# Patient Record
Sex: Female | Born: 1955 | Race: White | Hispanic: No | Marital: Single | Smoking: Never smoker
Health system: Southern US, Community
[De-identification: ages and names within clinical notes are randomized; demographics above are authoritative.]

## PROBLEM LIST (undated history)

## (undated) DIAGNOSIS — K5792 Diverticulitis of intestine, part unspecified, without perforation or abscess without bleeding: Secondary | ICD-10-CM

## (undated) DIAGNOSIS — G629 Polyneuropathy, unspecified: Secondary | ICD-10-CM

---

## 2022-05-20 ENCOUNTER — Emergency Department (HOSPITAL_COMMUNITY): Payer: 59

## 2022-05-20 ENCOUNTER — Emergency Department (HOSPITAL_COMMUNITY)
Admission: EM | Admit: 2022-05-20 | Discharge: 2022-05-20 | Disposition: A | Discharge status: Home / Self Care | Payer: 59 | Attending: Emergency Medicine | Admitting: Emergency Medicine

## 2022-05-20 ENCOUNTER — Encounter (HOSPITAL_COMMUNITY): Payer: Self-pay

## 2022-05-20 DIAGNOSIS — K429 Umbilical hernia without obstruction or gangrene: Secondary | ICD-10-CM | POA: Insufficient documentation

## 2022-05-20 DIAGNOSIS — K760 Fatty (change of) liver, not elsewhere classified: Secondary | ICD-10-CM | POA: Diagnosis not present

## 2022-05-20 DIAGNOSIS — K5792 Diverticulitis of intestine, part unspecified, without perforation or abscess without bleeding: Secondary | ICD-10-CM

## 2022-05-20 DIAGNOSIS — Z760 Encounter for issue of repeat prescription: Secondary | ICD-10-CM | POA: Diagnosis not present

## 2022-05-20 DIAGNOSIS — R1030 Lower abdominal pain, unspecified: Secondary | ICD-10-CM | POA: Diagnosis present

## 2022-05-20 DIAGNOSIS — G8929 Other chronic pain: Secondary | ICD-10-CM | POA: Diagnosis not present

## 2022-05-20 DIAGNOSIS — J45909 Unspecified asthma, uncomplicated: Secondary | ICD-10-CM | POA: Diagnosis not present

## 2022-05-20 DIAGNOSIS — Z880 Allergy status to penicillin: Secondary | ICD-10-CM | POA: Diagnosis not present

## 2022-05-20 DIAGNOSIS — M1990 Unspecified osteoarthritis, unspecified site: Secondary | ICD-10-CM | POA: Diagnosis not present

## 2022-05-20 HISTORY — DX: Polyneuropathy, unspecified: G62.9

## 2022-05-20 HISTORY — DX: Diverticulitis of intestine, part unspecified, without perforation or abscess without bleeding: K57.92

## 2022-05-20 LAB — CBC WITH DIFFERENTIAL/PLATELET
Abs Immature Granulocytes: 0 10*3/uL (ref 0.00–0.07)
Basophils Absolute: 0 10*3/uL (ref 0.0–0.1)
Basophils Relative: 0 %
Eosinophils Absolute: 0 10*3/uL (ref 0.0–0.5)
Eosinophils Relative: 0 %
HCT: 39.1 % (ref 36.0–46.0)
Hemoglobin: 12.6 g/dL (ref 12.0–15.0)
Immature Granulocytes: 0 %
Lymphocytes Relative: 39 %
Lymphs Abs: 1.2 10*3/uL (ref 0.7–4.0)
MCH: 34.6 pg — ABNORMAL HIGH (ref 26.0–34.0)
MCHC: 32.2 g/dL (ref 30.0–36.0)
MCV: 107.4 fL — ABNORMAL HIGH (ref 80.0–100.0)
Monocytes Absolute: 0.5 10*3/uL (ref 0.1–1.0)
Monocytes Relative: 15 %
Neutro Abs: 1.4 10*3/uL — ABNORMAL LOW (ref 1.7–7.7)
Neutrophils Relative %: 46 %
Platelets: 139 10*3/uL — ABNORMAL LOW (ref 150–400)
RBC: 3.64 MIL/uL — ABNORMAL LOW (ref 3.87–5.11)
RDW: 14.6 % (ref 11.5–15.5)
WBC: 3 10*3/uL — ABNORMAL LOW (ref 4.0–10.5)
nRBC: 0 % (ref 0.0–0.2)

## 2022-05-20 LAB — COMPREHENSIVE METABOLIC PANEL
ALT: 22 U/L (ref 0–44)
AST: 81 U/L — ABNORMAL HIGH (ref 15–41)
Albumin: 3.6 g/dL (ref 3.5–5.0)
Alkaline Phosphatase: 68 U/L (ref 38–126)
Anion gap: 8 (ref 5–15)
BUN: 9 mg/dL (ref 8–23)
CO2: 19 mmol/L — ABNORMAL LOW (ref 22–32)
Calcium: 9 mg/dL (ref 8.9–10.3)
Chloride: 108 mmol/L (ref 98–111)
Creatinine, Ser: 0.65 mg/dL (ref 0.44–1.00)
GFR, Estimated: >60 mL/min (ref >60)
Glucose, Bld: 105 mg/dL — ABNORMAL HIGH (ref 70–99)
Potassium: 4 mmol/L (ref 3.5–5.1)
Sodium: 135 mmol/L (ref 135–145)
Total Bilirubin: 0.7 mg/dL (ref 0.3–1.2)
Total Protein: 7 g/dL (ref 6.5–8.1)

## 2022-05-20 LAB — URINALYSIS, ROUTINE W REFLEX MICROSCOPIC
Bilirubin Urine: NEGATIVE
Glucose, UA: NEGATIVE mg/dL
Hgb urine dipstick: NEGATIVE
Ketones, ur: 5 mg/dL — AB
Leukocytes,Ua: NEGATIVE
Nitrite: NEGATIVE
Protein, ur: NEGATIVE mg/dL
Specific Gravity, Urine: 1.019 (ref 1.005–1.030)
pH: 5 (ref 5.0–8.0)

## 2022-05-20 LAB — LIPASE, BLOOD: Lipase: 23 U/L (ref 11–51)

## 2022-05-20 MED ORDER — METRONIDAZOLE 500 MG PO TABS
500.0000 mg | ORAL_TABLET | Freq: Once | ORAL | Status: AC
  Administered 2022-05-20: 500 mg via ORAL
  Filled 2022-05-20: qty 1

## 2022-05-20 MED ORDER — HYDROCODONE-ACETAMINOPHEN 5-325 MG PO TABS: 1.0000 | ORAL_TABLET | Freq: Four times a day (QID) | ORAL | 0 refills | Status: AC | PRN

## 2022-05-20 MED ORDER — FENTANYL CITRATE PF 50 MCG/ML IJ SOSY
25.0000 ug | PREFILLED_SYRINGE | Freq: Once | INTRAMUSCULAR | Status: AC
  Administered 2022-05-20: 25 ug via INTRAVENOUS
  Filled 2022-05-20: qty 1

## 2022-05-20 MED ORDER — SODIUM CHLORIDE 0.9 % IV BOLUS
1000.0000 mL | Freq: Once | INTRAVENOUS | Status: AC
  Administered 2022-05-20: 1000 mL via INTRAVENOUS

## 2022-05-20 MED ORDER — IOHEXOL 300 MG/ML  SOLN
100.0000 mL | Freq: Once | INTRAMUSCULAR | Status: AC | PRN
  Administered 2022-05-20: 100 mL via INTRAVENOUS

## 2022-05-20 MED ORDER — CIPROFLOXACIN HCL 500 MG PO TABS: 500.0000 mg | ORAL_TABLET | Freq: Two times a day (BID) | ORAL | 0 refills | Status: AC

## 2022-05-20 MED ORDER — CIPROFLOXACIN HCL 500 MG PO TABS
500.0000 mg | ORAL_TABLET | Freq: Once | ORAL | Status: AC
  Administered 2022-05-20: 500 mg via ORAL
  Filled 2022-05-20: qty 1

## 2022-05-20 MED ORDER — ALBUTEROL SULFATE HFA 108 (90 BASE) MCG/ACT IN AERS
1.0000 | INHALATION_SPRAY | Freq: Once | RESPIRATORY_TRACT | Status: AC
  Administered 2022-05-20: 1 via RESPIRATORY_TRACT
  Filled 2022-05-20: qty 6.7

## 2022-05-20 MED ORDER — METRONIDAZOLE 500 MG PO TABS: 500.0000 mg | ORAL_TABLET | Freq: Two times a day (BID) | ORAL | 0 refills | Status: AC

## 2022-05-20 NOTE — Discharge Instructions (Addendum)
Please take antibiotics as prescribed.  Please take with food and do not mix with alcohol.  Like for you to return to the emergency department for any worsening symptoms or you have.  I have also sent some pain medication to go home with.

## 2022-05-20 NOTE — ED Provider Notes (Signed)
[facility]-EMERGENCY DEPT Provider Note   CSN: [id number] Arrival date & time: 05/20/22  1027     History Chief Complaint  Patient presents with   Back Pain   Diarrhea    Elizabeth Padilla is a 66 y.o. female with history of diverticulitis and neuropathy who presents the emergency room with lower abdominal pain that started yesterday.  Patient also complaining of an exacerbation of her chronic arthritic pain.  She is also been having diarrhea for several days.  No nausea, vomiting, urinary complaints, fever, chills.  She is been taking Tylenol with no improvement.   Back Pain Diarrhea      Home Medications Prior to Admission medications   Medication Sig Start Date End Date Taking? Authorizing Provider  ciprofloxacin (CIPRO) 500 MG tablet Take 1 tablet (500 mg total) by mouth 2 (two) times daily. 05/20/22  Yes Meredeth Ide, Keosha Rossa M, PA-C  HYDROcodone-acetaminophen (NORCO/VICODIN) 5-325 MG tablet Take 1-2 tablets by mouth every 6 (six) hours as needed. 05/20/22  Yes Meredeth Ide, Kilian Schwartz M, PA-C  metroNIDAZOLE (FLAGYL) 500 MG tablet Take 1 tablet (500 mg total) by mouth 2 (two) times daily. 05/20/22  Yes Meredeth Ide, Deondrick Searls M, PA-C      Allergies    Corticosteroids, Diphenhydramine, Penicillins, Tramadol, Ketorolac, Gabapentin, and Losartan    Review of Systems   Review of Systems  Gastrointestinal:  Positive for diarrhea.  Musculoskeletal:  Positive for back pain.  All other systems reviewed and are negative.   Physical Exam Updated Vital Signs BP 125/64   Pulse 98   Temp 98 F (36.7 C) (Oral)   Resp 13   SpO2 98%  Physical Exam Vitals and nursing note reviewed.  Constitutional:      General: She is not in acute distress.    Appearance: Normal appearance.  HENT:     Head: Normocephalic and atraumatic.  Eyes:     General:        Right eye: No discharge.        Left eye: No discharge.  Cardiovascular:     Comments: Regular rate and rhythm.  S1/S2 are distinct  without any evidence of murmur, rubs, or gallops.  Radial pulses are 2+ bilaterally.  Dorsalis pedis pulses are 2+ bilaterally.  No evidence of pedal edema. Pulmonary:     Comments: Clear to auscultation bilaterally.  Normal effort.  No respiratory distress.  No evidence of wheezes, rales, or rhonchi heard throughout. Abdominal:     General: Abdomen is flat. Bowel sounds are normal. There is no distension.     Tenderness: There is abdominal tenderness. There is no guarding or rebound.     Comments: Lower abdominal tenderness.  Musculoskeletal:        General: Normal range of motion.     Cervical back: Neck supple.  Skin:    General: Skin is warm and dry.     Findings: No rash.  Neurological:     General: No focal deficit present.     Mental Status: She is alert.  Psychiatric:        Mood and Affect: Mood normal.        Behavior: Behavior normal.     ED Results / Procedures / Treatments   Labs (all labs ordered are listed, but only abnormal results are displayed) Labs Reviewed  CBC WITH DIFFERENTIAL/PLATELET - Abnormal; Notable for the following components:      Result Value   WBC 3.0 (*)    RBC 3.64 (*)  MCV 107.4 (*)    MCH 34.6 (*)    Platelets 139 (*)    Neutro Abs 1.4 (*)    All other components within normal limits  COMPREHENSIVE METABOLIC PANEL - Abnormal; Notable for the following components:   CO2 19 (*)    Glucose, Bld 105 (*)    AST 81 (*)    All other components within normal limits  URINALYSIS, ROUTINE W REFLEX MICROSCOPIC - Abnormal; Notable for the following components:   Ketones, ur 5 (*)    All other components within normal limits  LIPASE, BLOOD    EKG None  Radiology CT ABDOMEN PELVIS W CONTRAST  Result Date: 05/20/2022 CLINICAL DATA:  Abdominal pain, diarrhea EXAM: CT ABDOMEN AND PELVIS WITH CONTRAST TECHNIQUE: Multidetector CT imaging of the abdomen and pelvis was performed using the standard protocol following bolus administration of  intravenous contrast. RADIATION DOSE REDUCTION: This exam was performed according to the departmental dose-optimization program which includes automated exposure control, adjustment of the mA and/or kV according to patient size and/or use of iterative reconstruction technique. CONTRAST:  142mL OMNIPAQUE IOHEXOL 300 MG/ML  SOLN COMPARISON:  None Available. FINDINGS: Lower chest: Unremarkable. Hepatobiliary: There is fatty infiltration in the liver. Liver measures 19.8 cm in length. There is no dilation of bile ducts. Gallbladder is distended. There is subtle increased density in the dependent portion of gallbladder. There is no wall thickening in gallbladder. There is no fluid around the gallbladder. Pancreas: No focal abnormality is seen. Spleen: Spleen measures 12.4 cm in maximum diameter. Adrenals/Urinary Tract: Adrenals are unremarkable. There is no hydronephrosis. There are no renal or ureteral stones. There are small foci of cortical thinning in the kidneys. Urinary bladder is not distended. Stomach/Bowel: Stomach is unremarkable. There is diverticulum along the inner margin of second portion of duodenum. Small bowel loops are not dilated. Appendix is not dilated. Scattered diverticula are seen in the colon. There is mild diffuse wall thickening in the sigmoid colon. There is minimal stranding in the fat planes adjacent to sigmoid. There is trace amount of free fluid in the right side of pelvis. There is no loculated thick-walled pericolic abscess. Vascular/Lymphatic: Arterial calcifications are seen. Reproductive: Uterus is not seen. Other: There is no pneumoperitoneum. Small umbilical hernia containing fat is seen. Musculoskeletal: There is 20 to 30% decrease in height of anterior aspect of body L2 vertebra with no definite demonstrable radiolucent fracture line. There is minimal anterolisthesis at L4-L5 level. IMPRESSION: There is no evidence of intestinal obstruction or pneumoperitoneum. There is no  hydronephrosis. Appendix is not dilated. Diverticulosis of colon. There is mild diffuse wall thickening in the sigmoid. There is minimal stranding in the fat planes adjacent to sigmoid colon. Findings may suggest residual changes from chronic diverticulitis or mild acute diverticulitis. There is no pericolic abscess. Enlarged fatty liver.  Enlarged spleen. There is 20-30% decrease in height of upper endplate of body L2 vertebra possibly old compression. Less likely possibility would be recent compression. Please correlate with clinical symptoms. Other findings as described in the body of the report. Electronically Signed   By: Elmer Picker M.D.   On: 05/20/2022 13:13    Procedures Procedures    Medications Ordered in ED Medications  fentaNYL (SUBLIMAZE) injection 25 mcg (has no administration in time range)  fentaNYL (SUBLIMAZE) injection 25 mcg (25 mcg Intravenous Given 05/20/22 1653)  iohexol (OMNIPAQUE) 300 MG/ML solution 100 mL (100 mLs Intravenous Contrast Given 05/20/22 1237)  sodium chloride 0.9 % bolus 1,000 mL (  1,000 mLs Intravenous New Bag/Given 05/20/22 1722)  albuterol (VENTOLIN HFA) 108 (90 Base) MCG/ACT inhaler 1 puff (1 puff Inhalation Given 05/20/22 1723)  ciprofloxacin (CIPRO) tablet 500 mg (500 mg Oral Given 05/20/22 1830)  metroNIDAZOLE (FLAGYL) tablet 500 mg (500 mg Oral Given 05/20/22 1830)    ED Course/ Medical Decision Making/ A&P Clinical Course as of 05/20/22 1905  Sun May 20, 2022  1850 CBC with Differential(!) There is evidence of leukopenia which seems to be at her baseline in comparison to previous results. [CF]  1850 Comprehensive metabolic panel(!) No evidence of kidney impairment.  Electrolytes are normal. [CF]  1851 Urinalysis, Routine w reflex microscopic Urine, Clean Catch(!) Urinalysis does not show any signs of infection. [CF]  1851 Lipase, blood Lipase negative. Pancreatitis less likely.  [CF]  1852 CT ABDOMEN PELVIS W CONTRAST I personally reviewed  the CT abdomen. There is some evidence of diverticulitis on imaging. I do agree with the radiologist interpretation. Patient has a penicillin allergy and will cover her with cipro and flagyl.  [CF]  1853 On reevaluation, patient is feeling better and tolerating p.o.  She wishes to go home which I feel is an amendable plan. [CF]    Clinical Course User Index [CF] Teressa Lower, PA-C                           Medical Decision Making Amisadai Biggerstaff is a 66 y.o. female patient who presents to the emergency department today for further evaluation of lower abdominal pain.  Differential diagnosis includes cystitis, pyelonephritis, kidney stone, diverticulitis.  Patient is overall well-appearing and nontoxic.  I initially evaluated the patient in triage and ordered all the labs.  I have also personally reviewed the labs which is highlighted in the ED course.   Amount and/or Complexity of Data Reviewed External Data Reviewed: labs and notes.    Details: Did review the notes where she was seen and evaluated at [facility].  Her leukopenia seems to be at baseline in comparison to those results. Labs: ordered. Decision-making details documented in ED Course. Radiology: ordered and independent interpretation performed. Decision-making details documented in ED Course.  Risk Prescription drug management. Parenteral controlled substances. Risk Details: Patient feeling much better after fluids and pain medication.  We will give her 1 more round of pain medication.  I do feel the patient is likely suitable to go home.  We will give her antibiotics to cover her for mild acute uncomplicated diverticulitis.  Patient does have an penicillin allergy.  We will give her Cipro and Flagyl.  We will have her follow-up with her PCP for further evaluation.  Patient was encouraged to return to the emerged part for any worsening symptoms. She is safe for discharge.    Final Clinical Impression(s) / ED Diagnoses Final  diagnoses:  Lower abdominal pain  Diverticulitis    Rx / DC Orders ED Discharge Orders          Ordered    ciprofloxacin (CIPRO) 500 MG tablet  2 times daily        05/20/22 1857    metroNIDAZOLE (FLAGYL) 500 MG tablet  2 times daily        05/20/22 1857    HYDROcodone-acetaminophen (NORCO/VICODIN) 5-325 MG tablet  Every 6 hours PRN        05/20/22 1905              Teressa Lower, [place] 05/20/22 1905  Charlesetta Shanks, MD 05/22/22 [phone]

## 2022-05-20 NOTE — ED Provider Triage Note (Signed)
Emergency Medicine Provider Triage Evaluation Note  Elizabeth Padilla , a 66 y.o. female  was evaluated in triage.  Pt complains of lower abdominal pain and diarrhea.  Patient also complaining of her chronic arthritic pain which is not improved with Tylenol at home.  She states the abdominal pain feels similar to when she had diverticulitis in the past.  No nausea, vomiting, fever, chills.  No urinary complaints.  Review of Systems  Positive:  Negative: See above   Physical Exam  BP 114/71   Pulse 84   Temp 98 F (36.7 C) (Oral)   Resp 20   SpO2 100%  Gen:   Awake, no distress   Resp:  Normal effort  MSK:   Moves extremities without difficulty  Other:  Lower abdominal tenderness  Medical Decision Making  Medically screening exam initiated at 11:13 AM.  Appropriate orders placed.  Dura Mccormack was informed that the remainder of the evaluation will be completed by another provider, this initial triage assessment does not replace that evaluation, and the importance of remaining in the ED until their evaluation is complete.     Honor Loh Cushman, [place] 05/20/22 1114

## 2022-05-20 NOTE — ED Notes (Signed)
Pt given sandwich and bottle of water

## 2022-05-20 NOTE — ED Triage Notes (Signed)
Pt arrived via POV, c/o back and arm pain that she says is related to neuropathy and has had diarrhea for several days.

## 2022-06-07 ENCOUNTER — Ambulatory Visit: Payer: Self-pay | Admitting: Orthopedic Surgery

## 2023-04-16 IMAGING — CT CT ABD-PELV W/ CM
2 of 5 series · 15 of 46 positions shown, 17 images · IV contrast (agent unspecified)
Comparison: None Available.

CLINICAL DATA: Abdominal pain, diarrhea

EXAM:
CT ABDOMEN AND PELVIS WITH CONTRAST
TECHNIQUE: Multidetector CT imaging of the abdomen and pelvis was performed
using the standard protocol following bolus administration of
intravenous contrast.

[Series 3: axial st · axial · 0.92mm/px · z∈[-217,+173]mm · 12 of 92 slices shown, 14 images]
[im 7/92  soft-tissue]
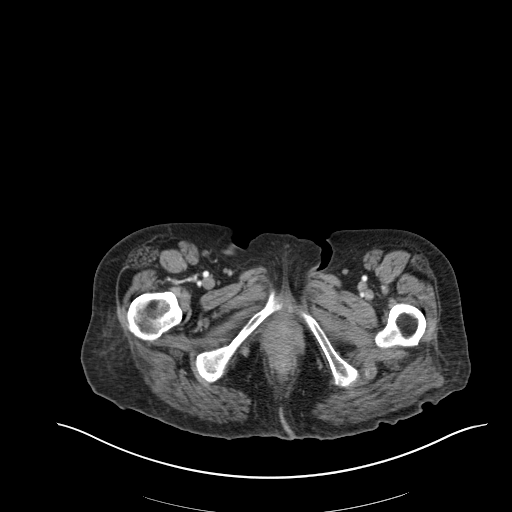
[im 7/92  bone]
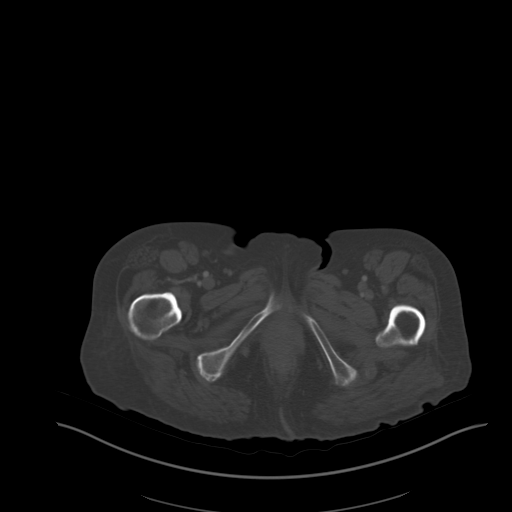
[im 14/92  soft-tissue]
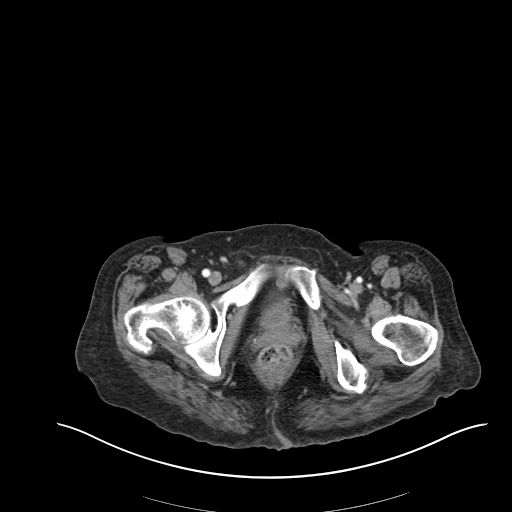
[im 20/92  soft-tissue]
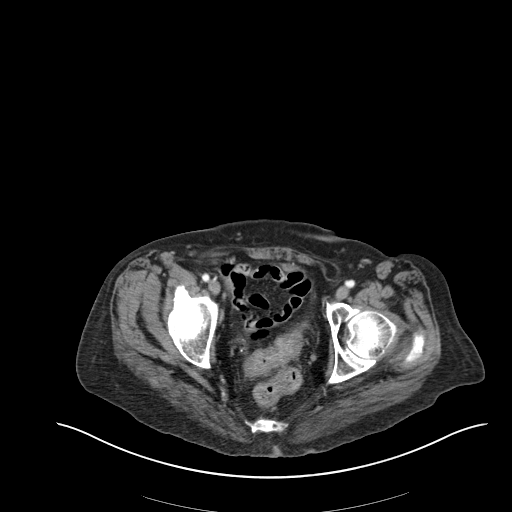
[im 27/92  soft-tissue]
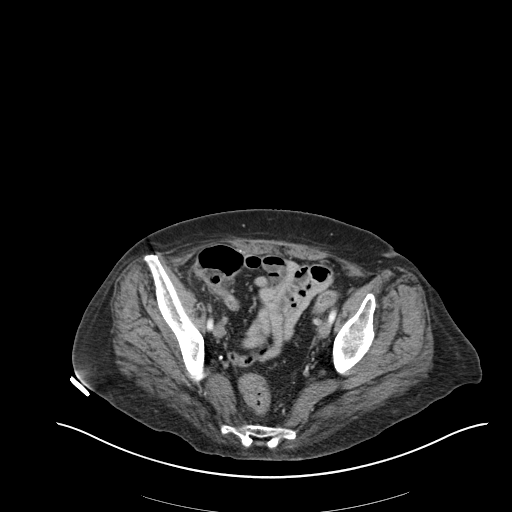
[im 33/92  soft-tissue]
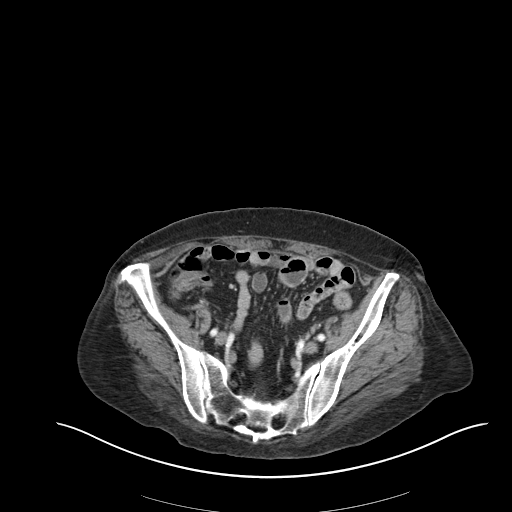
[im 40/92  soft-tissue]
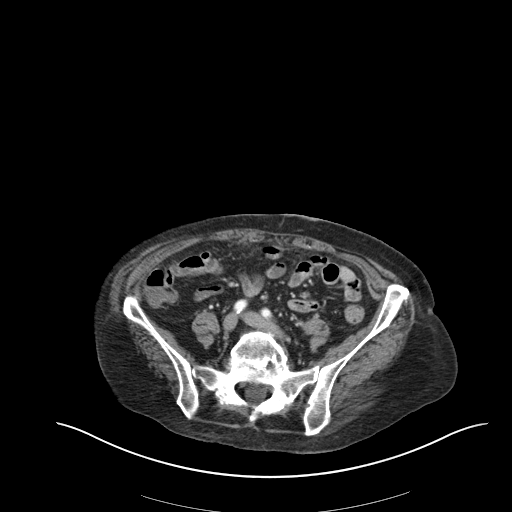
[im 53/92  soft-tissue]
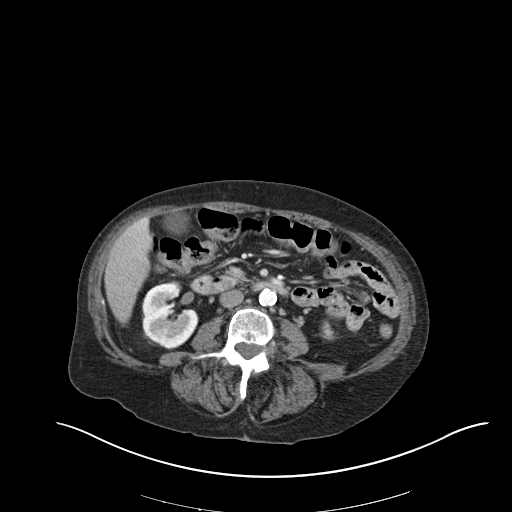
[im 59/92  soft-tissue]
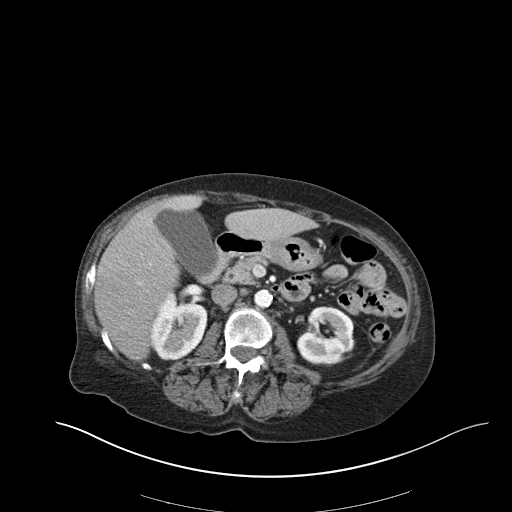
[im 66/92  soft-tissue]
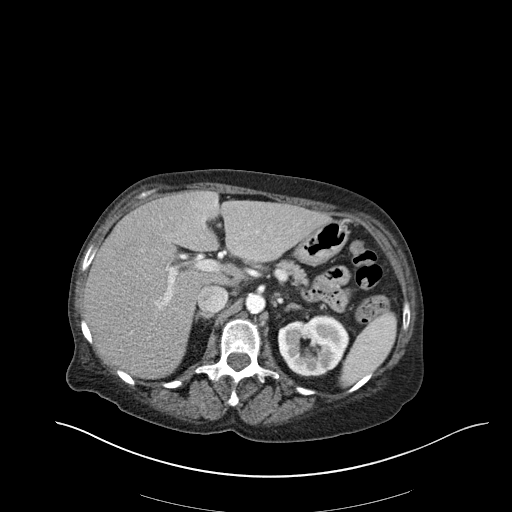
[im 66/92  bone]
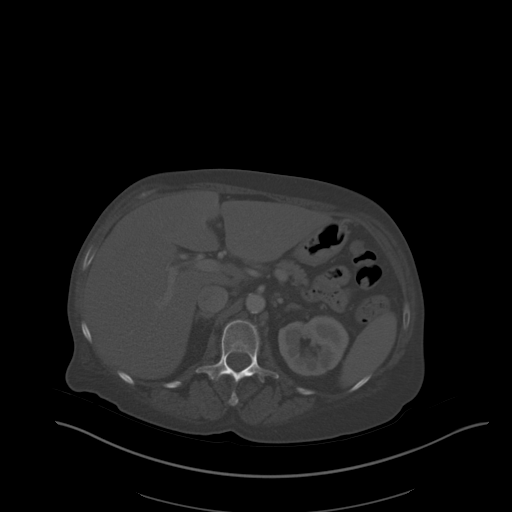
[im 72/92  soft-tissue]
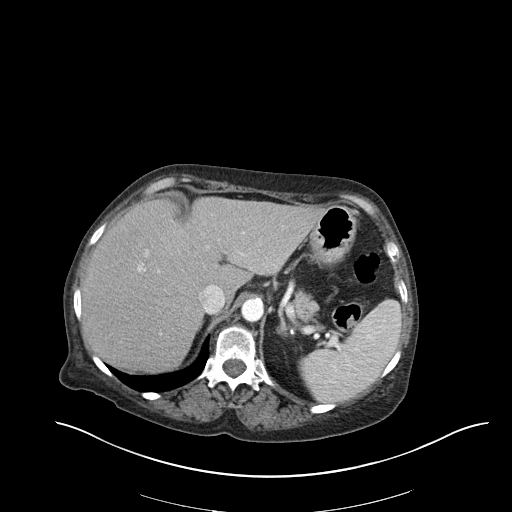
[im 79/92  soft-tissue]
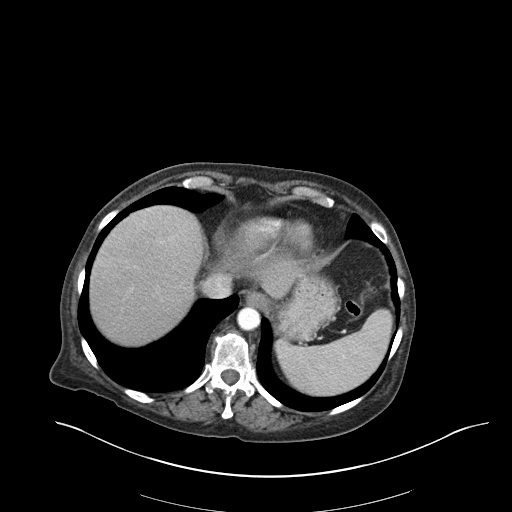
[im 85/92  soft-tissue]
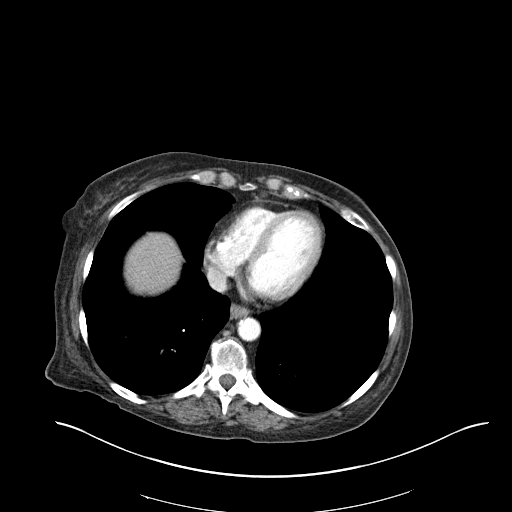

[Series 5: coronal st · coronal · 0.74mm/px · 3 of 139 slices shown]
[im 47/139  soft-tissue]
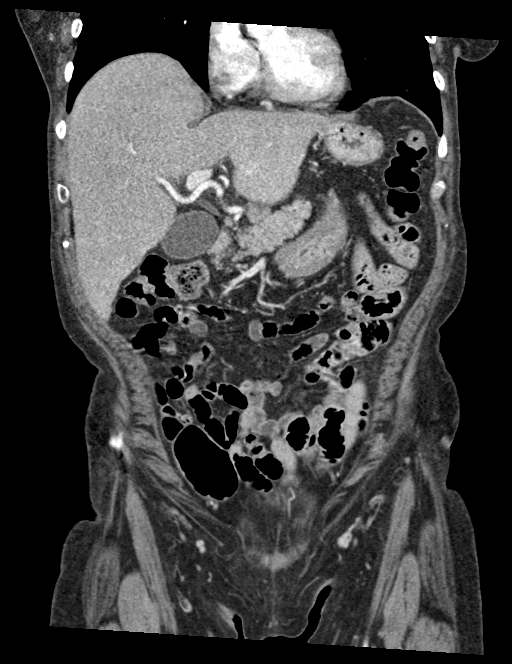
[im 62/139  soft-tissue]
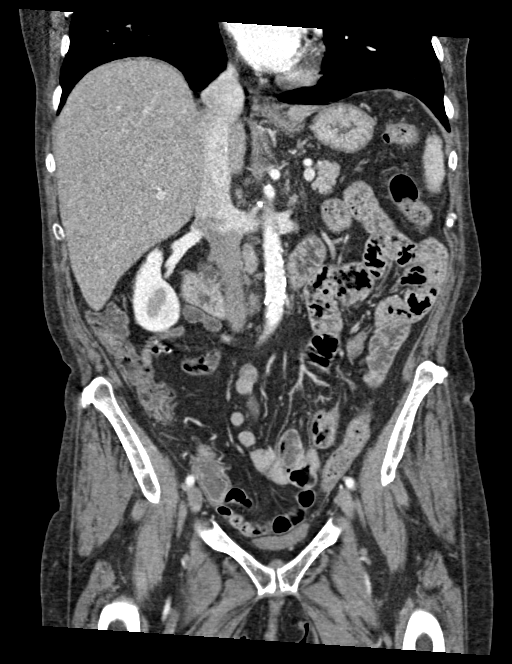
[im 77/139  soft-tissue]
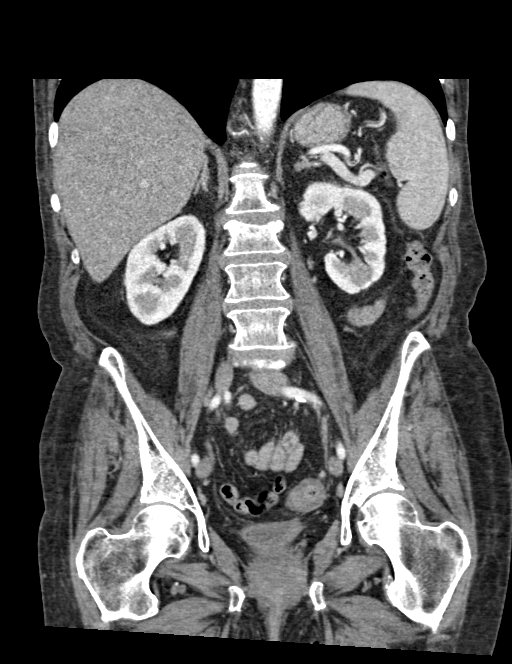

[15 of 46 positions shown; findings below may reference images not displayed]

RADIATION DOSE REDUCTION: This exam was performed according to the
departmental dose-optimization program which includes automated
exposure control, adjustment of the mA and/or kV according to
patient size and/or use of iterative reconstruction technique.

CONTRAST:  100mL OMNIPAQUE IOHEXOL 300 MG/ML  SOLN
FINDINGS: Lower chest: Unremarkable.

Hepatobiliary: There is fatty infiltration in the liver. Liver
measures 19.8 cm in length. There is no dilation of bile ducts.
Gallbladder is distended. There is subtle increased density in the
dependent portion of gallbladder. There is no wall thickening in
gallbladder. There is no fluid around the gallbladder.

Pancreas: No focal abnormality is seen.

Spleen: Spleen measures 12.4 cm in maximum diameter.

Adrenals/Urinary Tract: Adrenals are unremarkable. There is no
hydronephrosis. There are no renal or ureteral stones. There are
small foci of cortical thinning in the kidneys. Urinary bladder is
not distended.

Stomach/Bowel: Stomach is unremarkable. There is diverticulum along
the inner margin of second portion of duodenum. Small bowel loops
are not dilated. Appendix is not dilated. Scattered diverticula are
seen in the colon. There is mild diffuse wall thickening in the
sigmoid colon. There is minimal stranding in the fat planes adjacent
to sigmoid. There is trace amount of free fluid in the right side of
pelvis. There is no loculated thick-walled pericolic abscess.

Vascular/Lymphatic: Arterial calcifications are seen.

Reproductive: Uterus is not seen.

Other: There is no pneumoperitoneum. Small umbilical hernia
containing fat is seen.

Musculoskeletal: There is 20 to 30% decrease in height of anterior
aspect of body L2 vertebra with no definite demonstrable radiolucent
fracture line. There is minimal anterolisthesis at L4-L5 level.
IMPRESSION: There is no evidence of intestinal obstruction or pneumoperitoneum.
There is no hydronephrosis. Appendix is not dilated.

Diverticulosis of colon. There is mild diffuse wall thickening in
the sigmoid. There is minimal stranding in the fat planes adjacent
to sigmoid colon. Findings may suggest residual changes from chronic
diverticulitis or mild acute diverticulitis. There is no pericolic
abscess.

Enlarged fatty liver.  Enlarged spleen.

There is 20-30% decrease in height of upper endplate of body L2
vertebra possibly old compression. Less likely possibility would be
recent compression. Please correlate with clinical symptoms.

Other findings as described in the body of the report.
